# Patient Record
Sex: Male | Born: 2005 | Race: Asian | Hispanic: No | Marital: Single | Smoking: Never smoker
Health system: Southern US, Community
[De-identification: ages and names within clinical notes are randomized; demographics above are authoritative.]

## PROBLEM LIST (undated history)

## (undated) DIAGNOSIS — Z789 Other specified health status: Secondary | ICD-10-CM

## (undated) HISTORY — DX: Other specified health status: Z78.9

## (undated) HISTORY — PX: NO PAST SURGERIES: SHX2092

---
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
## 2023-01-28 ENCOUNTER — Ambulatory Visit (INDEPENDENT_AMBULATORY_CARE_PROVIDER_SITE_OTHER): Payer: Medicaid Other | Admitting: Pediatrics

## 2023-01-28 ENCOUNTER — Encounter: Payer: Self-pay | Admitting: Pediatrics

## 2023-01-28 VITALS — BP 114/68 | Ht 65.0 in | Wt 156.6 lb

## 2023-01-28 DIAGNOSIS — Z0000 Encounter for general adult medical examination without abnormal findings: Secondary | ICD-10-CM | POA: Diagnosis not present

## 2023-01-28 DIAGNOSIS — Z0001 Encounter for general adult medical examination with abnormal findings: Secondary | ICD-10-CM

## 2023-01-28 DIAGNOSIS — E7800 Pure hypercholesterolemia, unspecified: Secondary | ICD-10-CM

## 2023-01-28 DIAGNOSIS — Z68.41 Body mass index (BMI) pediatric, 5th percentile to less than 85th percentile for age: Secondary | ICD-10-CM

## 2023-01-28 DIAGNOSIS — R748 Abnormal levels of other serum enzymes: Secondary | ICD-10-CM

## 2023-01-28 LAB — CBC WITH DIFFERENTIAL/PLATELET
Absolute Monocytes: 828 cells/uL (ref 200–900)
Basophils Absolute: 54 cells/uL (ref 0–200)
Basophils Relative: 0.6 %
Eosinophils Absolute: 180 cells/uL (ref 15–500)
Eosinophils Relative: 2 %
HCT: 47.2 % (ref 36.0–49.0)
Hemoglobin: 15.8 g/dL (ref 12.0–16.9)
Lymphs Abs: 4104 cells/uL (ref 1200–5200)
MCH: 31.7 pg (ref 25.0–35.0)
MCHC: 33.5 g/dL (ref 31.0–36.0)
MCV: 94.8 fL (ref 78.0–98.0)
MPV: 11.2 fL (ref 7.5–12.5)
Monocytes Relative: 9.2 %
Neutro Abs: 3834 cells/uL (ref 1800–8000)
Neutrophils Relative %: 42.6 %
Platelets: 275 10*3/uL (ref 140–400)
RBC: 4.98 10*6/uL (ref 4.10–5.70)
RDW: 11.6 % (ref 11.0–15.0)
Total Lymphocyte: 45.6 %
WBC: 9 10*3/uL (ref 4.5–13.0)

## 2023-01-28 MED ORDER — TRETINOIN 0.05 % EX CREA: TOPICAL_CREAM | Freq: Every day | CUTANEOUS | 12 refills | Status: AC

## 2023-01-28 MED ORDER — CLINDAMYCIN PHOS-BENZOYL PEROX 1.2-5 % EX GEL: 1.0000 | Freq: Two times a day (BID) | CUTANEOUS | 12 refills | Status: AC

## 2023-01-28 NOTE — Patient Instructions (Signed)
Preventive Care 18-19 Years Old, Male Preventive care refers to lifestyle choices and visits with your health care provider that can promote health and wellness. At this stage in your life, you may start seeing a primary care physician instead of a pediatrician for your preventive care. Preventive care visits are also called wellness exams. What can I expect for my preventive care visit? Counseling During your preventive care visit, your health care provider may ask about your: Medical history, including: Past medical problems. Family medical history. Current health, including: Home life and relationship well-being. Emotional well-being. Sexual activity and sexual health. Lifestyle, including: Alcohol, nicotine or tobacco, and drug use. Access to firearms. Diet, exercise, and sleep habits. Sunscreen use. Motor vehicle safety. Physical exam Your health care provider may check your: Height and weight. These may be used to calculate your BMI (body mass index). BMI is a measurement that tells if you are at a healthy weight. Waist circumference. This measures the distance around your waistline. This measurement also tells if you are at a healthy weight and may help predict your risk of certain diseases, such as type 2 diabetes and high blood pressure. Heart rate and blood pressure. Body temperature. Skin for abnormal spots. What immunizations do I need?  Vaccines are usually given at various ages, according to a schedule. Your health care provider will recommend vaccines for you based on your age, medical history, and lifestyle or other factors, such as travel or where you work. What tests do I need? Screening Your health care provider may recommend screening tests for certain conditions. This may include: Vision and hearing tests. Lipid and cholesterol levels. Hepatitis B test. Hepatitis C test. HIV (human immunodeficiency virus) test. STI (sexually transmitted infection) testing, if  you are at risk. Tuberculosis skin test. Talk with your health care provider about your test results, treatment options, and if necessary, the need for more tests. Follow these instructions at home: Eating and drinking  Eat a healthy diet that includes fresh fruits and vegetables, whole grains, lean protein, and low-fat dairy products. Drink enough fluid to keep your urine pale yellow. Do not drink alcohol if: Your health care provider tells you not to drink. You are under the legal drinking age. In the [place] the legal drinking age is 21. If you drink alcohol: Limit how much you have to 0-2 drinks a day. Know how much alcohol is in your drink. In the [place] one drink equals one 12 oz bottle of beer (355 mL), one 5 oz glass of wine (148 mL), or one 1 oz glass of hard liquor (44 mL). Lifestyle Brush your teeth every morning and night with fluoride toothpaste. Floss one time each day. Exercise for at least 30 minutes 5 or more days of the week. Do not use any products that contain nicotine or tobacco. These products include cigarettes, chewing tobacco, and vaping devices, such as e-cigarettes. If you need help quitting, ask your health care provider. Do not use drugs. If you are sexually active, practice safe sex. Use a condom or other form of protection to prevent STIs. Find healthy ways to manage stress, such as: Meditation, yoga, or listening to music. Journaling. Talking to a trusted person. Spending time with friends and family. Safety Always wear your seat belt while driving or riding in a vehicle. Do not drive: If you have been drinking alcohol. Do not ride with someone who has been drinking. When you are tired or distracted. While texting. If you have been using   any mind-altering substances or drugs. Wear a helmet and other protective equipment during sports activities. If you have firearms in your house, make sure you follow all gun safety procedures. Seek help if you have  been bullied, physically abused, or sexually abused. Use the internet responsibly to avoid dangers, such as online bullying and online sex predators. What's next? Go to your health care provider once a year for an annual wellness visit. Ask your health care provider how often you should have your eyes and teeth checked. Stay up to date on all vaccines. This information is not intended to replace advice given to you by your health care provider. Make sure you discuss any questions you have with your health care provider. Document Revised: 12/14/2020 Document Reviewed: 12/14/2020 Elsevier Patient Education  2024 Elsevier Inc.  

## 2023-01-28 NOTE — Progress Notes (Signed)
Subjective:    Robert Washington is a 17 y.o. male who presents for Medicare Annual/Subsequent preventive examination.   Preventive Screening-Counseling & Management  Tobacco Social History   Tobacco Use  Smoking Status Never  Smokeless Tobacco Never    Current Problems (verified) Patient Active Problem List   Diagnosis Date Noted   Encounter for annual physical exam 01/28/2023   BMI (body mass index), pediatric, 5% to less than 85% for age 41/29/2024   Hypercholesterolemia 03/28/2022   Elevated liver enzymes 03/28/2022    Medications Prior to Visit None   Current Medications (verified) Current Outpatient Medications  Medication Sig Dispense Refill   Clindamycin-Benzoyl Per, Refr, gel Apply 1 Application topically 2 (two) times daily. 45 g 12   tretinoin (RETIN-A) 0.05 % cream Apply topically at bedtime. 45 g 12   No current facility-administered medications for this visit.     Allergies (verified) Patient has no known allergies.   PAST HISTORY   Social History Social History   Tobacco Use   Smoking status: Never   Smokeless tobacco: Never  Substance Use Topics   Alcohol use: Never    Are there smokers in your home (other than you)?  Yes  Risk Factors Current exercise habits: Home exercise routine includes stretching and treadmill.  Dietary issues discussed: yes   Cardiac risk factors: dyslipidemia.  Depression Screen (Note: if answer to either of the following is "Yes", a more complete depression screening is indicated)   Q1: Over the past two weeks, have you felt down, depressed or hopeless? No  Q2: Over the past two weeks, have you felt little interest or pleasure in doing things? No  Have you lost interest or pleasure in daily life? No  Do you often feel hopeless? No  Do you cry easily over simple problems? No   Immunization History  Administered Date(s) Administered   DTaP 04/14/2004, 06/06/2004, 07/07/2004, 08/28/2005, 08/28/2008   HIB  (PRP-OMP) 04/14/2004   HPV 9-valent 02/06/2016, 02/11/2017   Hepatitis A 04/12/2005, 11/15/2005   Hepatitis B 03/06/2004, 04/14/2004, 09/14/2004, 03/26/2008   IPV 04/14/2004, 06/06/2004, 07/07/2004, 03/26/2005   Influenza Split 08/28/2008, 06/10/2010   Influenza,inj,Quad PF,6+ Mos 05/18/2021   MMR 10/09/2005, 06/16/2008   MenQuadfi_Meningococcal Groups ACYW Conjugate 02/18/2020   Meningococcal Conjugate 02/06/2016   PFIZER(Purple Top)SARS-COV-2 Vaccination 12/19/2019, 01/09/2020   PPD Test 08/04/2013   Pneumococcal Conjugate-13 04/14/2004, 04/11/2006   Rabies Immune Globulin 04/17/2021, 04/20/2021, 04/24/2021, 05/02/2021   Rabies, IM 04/17/2021, 04/20/2021, 04/24/2021, 05/02/2021   Rotavirus Pentavalent 03/12/2006   Tdap 02/06/2016   Varicella 06/07/2005, 08/28/2008    Screening Tests Health Maintenance  Topic Date Due   HIV Screening  Never done   COVID-19 Vaccine (3 - 2023-24 season) 02/13/2023 (Originally 03/02/2022)   DTaP/Tdap/Td (7 - Td or Tdap) 02/05/2026   HPV VACCINES  Completed   Hepatitis C Screening  Completed   INFLUENZA VACCINE  Discontinued    All answers were reviewed with the patient and necessary referrals were made:  Georgiann Hahn, MD   01/28/2023   History reviewed: allergies, current medications, past family history, past medical history, past social history, past surgical history, and problem list  Review of Systems Pertinent items are noted in HPI.    Objective:     Vision by Snellen chart: right eye:20/20, left eye:20/20 Blood pressure 114/68, height 5\' 5"  (1.651 m), weight 156 lb 9.6 oz (71 kg). Body mass index is 26.06 kg/m.  BP 114/68   Ht 5\' 5"  (1.651 m)  Wt 156 lb 9.6 oz (71 kg)   BMI 26.06 kg/m  General appearance: alert, cooperative, and no distress Head: Normocephalic, without obvious abnormality Eyes: negative Ears: normal TM's and external ear canals both ears Nose: no discharge Throat: lips, mucosa, and tongue normal; teeth  and gums normal Neck: no adenopathy and supple, symmetrical, trachea midline Back: no kyphosis present, no scoliosis present, symmetric, no curvature. ROM normal. No CVA tenderness. Lungs: clear to auscultation bilaterally Heart: regular rate and rhythm, S1, S2 normal, no murmur, click, rub or gallop Abdomen: soft, non-tender; bowel sounds normal; no masses,  no organomegaly Male genitalia: normal Extremities: extremities normal, atraumatic, no cyanosis or edema Pulses: 2+ and symmetric Skin: Skin color, texture, turgor normal. No rashes or lesions Neurologic: Grossly normal     Assessment:     Well male   Acne --medications ordered  Dyslipidemia and Non infectious hepatitis --Labs as ordered     Plan:     During the course of the visit the patient was educated and counseled about appropriate screening and preventive services including:   Diabetes screening Nutrition counseling  Orders Placed This Encounter  Procedures   CBC with Differential/Platelet   Comprehensive metabolic panel   HIV antibody (with reflex)   TSH   T4, free   Lipid panel      Patient Instructions (the written plan) was given to the patient.  Medicare Attestation I have personally reviewed: The patient's medical and social history Their use of alcohol, tobacco or illicit drugs Their current medications and supplements The patient's functional ability including ADLs,fall risks, home safety risks, cognitive, and hearing and visual impairment Diet and physical activities Evidence for depression or mood disorders  The patient's weight, height, BMI, and visual acuity have been recorded in the chart.  I have made referrals, counseling, and provided education to the patient based on review of the above and I have provided the patient with a written personalized care plan for preventive services.     Georgiann Hahn, MD   01/28/2023

## 2023-02-06 ENCOUNTER — Encounter: Payer: Self-pay | Admitting: Pediatrics

## 2023-02-07 ENCOUNTER — Telehealth: Payer: Self-pay | Admitting: Pediatrics

## 2023-02-07 DIAGNOSIS — R7989 Other specified abnormal findings of blood chemistry: Secondary | ICD-10-CM

## 2023-02-07 NOTE — Telephone Encounter (Signed)
Based on LABS will send to adult endocrine for follow up of elevated TSH       Component Ref Range & Units 10 d ago 10 mo ago 4 yr ago  TSH 0.50 - 4.30 mIU/L 7.18 High  4.40 High  3.97  Resulting Agency [facility] [place] [facility]

## 2023-02-12 NOTE — Telephone Encounter (Signed)
Referral has been placed in epic for [facility] Endocrinology.

## 2023-02-19 ENCOUNTER — Encounter: Payer: Self-pay | Admitting: Pediatrics

## 2023-02-25 ENCOUNTER — Telehealth: Payer: Self-pay | Admitting: Pediatrics

## 2023-02-25 DIAGNOSIS — L700 Acne vulgaris: Secondary | ICD-10-CM

## 2023-02-25 NOTE — Telephone Encounter (Signed)
Will send referral for ACNE   Washington, Robert [facility] Peds Clinical Pool Phone Number: [phone]   I've scheduled an appointment with [facility] dermatology at [address] [place] [address]. They said that you would have to send them a referral since they usually don't contact for referrals.

## 2023-02-28 NOTE — Telephone Encounter (Signed)
Referred to [facility]at[facility] for acne. Due to insurance patient is unable to go to [facility].

## 2023-02-28 NOTE — Addendum Note (Signed)
Addended by: Estevan Ryder on: 02/28/2023 12:25 PM   Modules accepted: Orders

## 2023-03-12 ENCOUNTER — Encounter: Payer: Self-pay | Admitting: Pediatrics

## 2023-08-08 ENCOUNTER — Encounter: Payer: Self-pay | Admitting: Pediatrics

## 2023-08-13 MED ORDER — AZELAIC ACID-NIACINAMIDE 15-4 % EX CREA: 1.0000 | TOPICAL_CREAM | Freq: Every day | CUTANEOUS | 12 refills | Status: AC

## 2023-08-13 MED ORDER — CLINDAMYCIN PHOSPHATE 1 % EX GEL: Freq: Two times a day (BID) | CUTANEOUS | 12 refills | Status: AC

## 2023-08-13 NOTE — Addendum Note (Signed)
Addended by: Georgiann Hahn on: 08/13/2023 08:52 PM   Modules accepted: Orders

## 2024-04-21 ENCOUNTER — Ambulatory Visit: Admitting: Dermatology

## 2024-04-21 ENCOUNTER — Encounter: Payer: Self-pay | Admitting: Dermatology

## 2024-04-21 VITALS — BP 108/71 | HR 54

## 2024-04-21 DIAGNOSIS — L308 Other specified dermatitis: Secondary | ICD-10-CM

## 2024-04-21 DIAGNOSIS — L700 Acne vulgaris: Secondary | ICD-10-CM | POA: Diagnosis not present

## 2024-04-21 DIAGNOSIS — L309 Dermatitis, unspecified: Secondary | ICD-10-CM

## 2024-04-21 MED ORDER — CLINDAMYCIN PHOSPHATE 1 % EX SWAB
1.0000 | Freq: Every morning | CUTANEOUS | 8 refills | Status: AC
Start: 2024-04-21 — End: ?

## 2024-04-21 MED ORDER — DOXYCYCLINE HYCLATE 100 MG PO TABS
100.0000 mg | ORAL_TABLET | Freq: Every evening | ORAL | 8 refills | Status: AC
Start: 2024-04-21 — End: 2025-01-16

## 2024-04-21 MED ORDER — RETIN-A 0.025 % EX CREA
TOPICAL_CREAM | Freq: Every day | CUTANEOUS | 2 refills | Status: AC
Start: 2024-04-21 — End: ?

## 2024-04-21 NOTE — Patient Instructions (Addendum)
 VISIT SUMMARY:  Today, you were seen for a dermatological consultation to address your acne and skin dryness. We discussed your current skincare regimen and past experiences with various treatments.  YOUR PLAN:  -ACNE VULGARIS:  Acne vulgaris is a common skin condition that causes pimples and oily skin. Your current regimen with salicylic acid is causing excessive dryness, so we will reduce its use to once a week during winter.  You should use a gentle cleanser like La Roche-Posay Lipcar or Toulourine wash. Apply clindamycin  swab in the morning followed by a moisturizer.  Use tretinoin  0.025% cream at night three times a week (Monday, Wednesday, Friday), and adjust the frequency based on your skin's tolerance.  We are also starting oral doxycycline 100mg  nightly to reduce inflammation, but be aware it may cause an upset stomach if not taken with food.  Samples of Avene Tolerance moisturizer and La Roche-Posay cleansers were provided.  DRY HANDS: Avoid washing with harsh soap and using hot water.  Apply Neutrogena [place] Hand Cream a few times a day (especially after washing hands and before bed)  INSTRUCTIONS:  We will reassess your treatment plan in May to make any necessary adjustments. Please follow the new skincare regimen and monitor your skin's response.    Important Information   Due to recent changes in healthcare laws, you may see results of your pathology and/or laboratory studies on MyChart before the doctors have had a chance to review them. We understand that in some cases there may be results that are confusing or concerning to you. Please understand that not all results are received at the same time and often the doctors may need to interpret multiple results in order to provide you with the best plan of care or course of treatment. Therefore, we ask that you please give us  2 business days to thoroughly review all your results before contacting the office for clarification.  Should we see a critical lab result, you will be contacted sooner.     If You Need Anything After Your Visit   If you have any questions or concerns for your doctor, please call our main line at [phone]. If no one answers, please leave a voicemail as directed and we will return your call as soon as possible. Messages left after 4 pm will be answered the following business day.    You may also send us  a message via MyChart. We typically respond to MyChart messages within 1-2 business days.  For prescription refills, please ask your pharmacy to contact our office. Our fax number is [phone].  If you have an urgent issue when the clinic is closed that cannot wait until the next business day, you can page your doctor at the number below.     Please note that while we do our best to be available for urgent issues outside of office hours, we are not available 24/7.    If you have an urgent issue and are unable to reach us , you may choose to seek medical care at your doctor's office, retail clinic, urgent care center, or emergency room.   If you have a medical emergency, please immediately call 911 or go to the emergency department. In the event of inclement weather, please call our main line at [phone] for an update on the status of any delays or closures.  Dermatology Medication Tips: Please keep the boxes that topical medications come in in order to help keep track of the instructions about where and how to  use these. Pharmacies typically print the medication instructions only on the boxes and not directly on the medication tubes.   If your medication is too expensive, please contact our office at [phone] or send us  a message through MyChart.    We are unable to tell what your co-pay for medications will be in advance as this is different depending on your insurance coverage. However, we may be able to find a substitute medication at lower cost or fill out paperwork to get  insurance to cover a needed medication.    If a prior authorization is required to get your medication covered by your insurance company, please allow us  1-2 business days to complete this process.   Drug prices often vary depending on where the prescription is filled and some pharmacies may offer cheaper prices.   The website www.goodrx.com contains coupons for medications through different pharmacies. The prices here do not account for what the cost may be with help from insurance (it may be cheaper with your insurance), but the website can give you the price if you did not use any insurance.  - You can print the associated coupon and take it with your prescription to the pharmacy.  - You may also stop by our office during regular business hours and pick up a GoodRx coupon card.  - If you need your prescription sent electronically to a different pharmacy, notify our office through [facility] or by phone at [phone]

## 2024-04-21 NOTE — Progress Notes (Signed)
 New Patient Visit   Subjective  Robert Washington is a 18 y.o. male who presents for the following: Acne  Patient states she has acne located at the face that he  would like to have examined. Patient reports the areas have been there for 2 years. He reports the areas are bothersome. He states the areas can be painful. Patient rates irritation 4 out of 10. He states that the areas has spread. Patient reports she has previously been treated for these areas by pcp. He was prescribed Tretinoin  0.05%, Clindamycin /BPO Gel 1.2%-5%. He used both about 7 months ago.   His current regimen consist of washing with LRP Effaclar (am and PM), followed 2% BHA Exfoliant every morning and LRP Moisturizer.  The following portions of the chart were reviewed this encounter and updated as appropriate: medications, allergies, medical history  Review of Systems:  No other skin or systemic complaints except as noted in HPI or Assessment and Plan.  Objective  Well appearing patient in no apparent distress; mood and affect are within normal limits.  A focused examination was performed of the following areas: Acne  Relevant exam findings are noted in the Assessment and Plan.           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 Previous treatments with benzoyl peroxide and tretinoin  were not well tolerated due to drying effects. Current regimen includes salicylic acid and clindamycin , but overuse of salicylic acid is causing excessive dryness. We will start of oral doxycycline to address inflammation and improve acne clearance.  - Discontinued daily use of salicylic acid; use once a week in winter. - Use a gentle cleanser such as La Roche-Posay Lipcar or Toulourine wash. - Apply clindamycin  swab in the morning followed by moisturizer. - Use tretinoin  0.025% at night, three times a week (Monday, Wednesday, Friday), adjusting frequency based on skin tolerance. - Start oral  doxycycline 100mg  QHS to reduce inflammation, with potential side effects including upset stomach if not taken with food. - Provided samples of Avene Tolerance moisturizer and La Roche-Posay cleansers. - Will reassess in May to adjust treatment as needed.  HAND DERMATITIS Exam: Scaly pink plaques +/- fissures  Flared  Hand Dermatitis is a chronic type of eczema that can come and go on the hands and fingers.  While there is no cure, the rash and symptoms can be managed with topical prescription medications, and for more severe cases, with systemic medications.  Recommend mild soap and routine use of moisturizing cream after handwashing.  Minimize soap/water exposure when possible.    Treatment Plan: - Recommend mild soap and moisturizing cream with hand washing.  - Recommended applying Neutrogena [place] Hand Cream after washing - Recommended trying OTC Hydrocortisone 1% 2 times daily for 2 weeks  ACNE VULGARIS   Related Medications clindamycin  (CLEOCIN  T) 1 % SWAB Apply 1 Application topically in the morning. Swab face in the morning after washing doxycycline (VIBRA-TABS) 100 MG tablet Take 1 tablet (100 mg total) by mouth at bedtime. TAKE WITH HEAVY MEALS TO PREVENT UPSET STOMACH RETIN-A  0.025 % cream Apply topically at bedtime. Apply topically at bedtime. Apply 1 night weekly for the first month, Apply 2 nights weekly for 1 month, If your skin tolerates increase to 3 nights weekly starting at 3 months. Decrease usage if you experience excessive dryness and during colder months to 1-2 nights  Return in about 7 months (around 11/19/2024) for Acne F/U.  I, Jetta  Ager, am acting as scribe for [employer], DO.  Documentation: I have reviewed the above documentation for accuracy and completeness, and I agree with the above.  Delon Lenis, DO

## 2024-06-02 ENCOUNTER — Encounter: Payer: Self-pay | Admitting: Dermatology

## 2024-07-10 ENCOUNTER — Encounter: Payer: Self-pay | Admitting: Dermatology

## 2024-11-19 ENCOUNTER — Ambulatory Visit: Admitting: Dermatology
